# Patient Record
Sex: Female | Born: 1955 | Race: Asian | Hispanic: No | Marital: Married | Smoking: Never smoker
Health system: Southern US, Community
[De-identification: ages and names within clinical notes are randomized; demographics above are authoritative.]

## PROBLEM LIST (undated history)

## (undated) HISTORY — PX: BREAST BIOPSY: SHX20

## (undated) HISTORY — PX: ABDOMINAL HYSTERECTOMY: SHX81

---

## 2016-02-14 ENCOUNTER — Other Ambulatory Visit: Payer: Self-pay | Admitting: Family Medicine

## 2016-02-14 DIAGNOSIS — Z1231 Encounter for screening mammogram for malignant neoplasm of breast: Secondary | ICD-10-CM

## 2016-06-04 ENCOUNTER — Ambulatory Visit
Admission: RE | Admit: 2016-06-04 | Discharge: 2016-06-04 | Disposition: A | Discharge status: Home / Self Care | Payer: Federal, State, Local not specified - PPO | Source: Ambulatory Visit | Attending: Family Medicine | Admitting: Family Medicine

## 2016-06-04 ENCOUNTER — Encounter: Payer: Self-pay | Admitting: Radiology

## 2016-06-04 DIAGNOSIS — Z1231 Encounter for screening mammogram for malignant neoplasm of breast: Secondary | ICD-10-CM | POA: Diagnosis not present

## 2016-06-12 ENCOUNTER — Other Ambulatory Visit: Payer: Self-pay | Admitting: *Deleted

## 2016-06-12 ENCOUNTER — Inpatient Hospital Stay
Admission: RE | Admit: 2016-06-12 | Discharge: 2016-06-12 | Disposition: A | Discharge status: Home / Self Care | Payer: Self-pay | Source: Ambulatory Visit | Attending: *Deleted | Admitting: *Deleted

## 2016-06-12 DIAGNOSIS — Z9289 Personal history of other medical treatment: Secondary | ICD-10-CM

## 2018-03-12 ENCOUNTER — Other Ambulatory Visit: Payer: Self-pay | Admitting: Family Medicine

## 2018-03-12 DIAGNOSIS — Z1231 Encounter for screening mammogram for malignant neoplasm of breast: Secondary | ICD-10-CM

## 2018-03-24 ENCOUNTER — Ambulatory Visit: Payer: Federal, State, Local not specified - PPO

## 2018-04-07 ENCOUNTER — Ambulatory Visit
Admission: RE | Admit: 2018-04-07 | Discharge: 2018-04-07 | Disposition: A | Discharge status: Home / Self Care | Payer: Federal, State, Local not specified - PPO | Source: Ambulatory Visit | Attending: Family Medicine | Admitting: Family Medicine

## 2018-04-07 ENCOUNTER — Encounter (INDEPENDENT_AMBULATORY_CARE_PROVIDER_SITE_OTHER): Payer: Self-pay

## 2018-04-07 DIAGNOSIS — Z1231 Encounter for screening mammogram for malignant neoplasm of breast: Secondary | ICD-10-CM | POA: Diagnosis present

## 2019-03-01 ENCOUNTER — Other Ambulatory Visit: Payer: Self-pay | Admitting: Family Medicine

## 2019-03-01 DIAGNOSIS — Z1231 Encounter for screening mammogram for malignant neoplasm of breast: Secondary | ICD-10-CM

## 2019-04-12 ENCOUNTER — Inpatient Hospital Stay: Admission: RE | Admit: 2019-04-12 | Payer: Federal, State, Local not specified - PPO | Source: Ambulatory Visit

## 2019-05-13 ENCOUNTER — Encounter: Payer: Self-pay | Admitting: Emergency Medicine

## 2019-05-13 ENCOUNTER — Ambulatory Visit
Admission: EM | Admit: 2019-05-13 | Discharge: 2019-05-13 | Disposition: A | Discharge status: Home / Self Care | Payer: Federal, State, Local not specified - PPO | Attending: Family Medicine | Admitting: Family Medicine

## 2019-05-13 ENCOUNTER — Telehealth: Payer: Self-pay | Admitting: Family Medicine

## 2019-05-13 ENCOUNTER — Other Ambulatory Visit: Payer: Self-pay

## 2019-05-13 ENCOUNTER — Ambulatory Visit (INDEPENDENT_AMBULATORY_CARE_PROVIDER_SITE_OTHER): Payer: Federal, State, Local not specified - PPO

## 2019-05-13 DIAGNOSIS — S39012A Strain of muscle, fascia and tendon of lower back, initial encounter: Secondary | ICD-10-CM | POA: Diagnosis not present

## 2019-05-13 DIAGNOSIS — M545 Low back pain: Secondary | ICD-10-CM

## 2019-05-13 DIAGNOSIS — M542 Cervicalgia: Secondary | ICD-10-CM | POA: Diagnosis not present

## 2019-05-13 DIAGNOSIS — S46911A Strain of unspecified muscle, fascia and tendon at shoulder and upper arm level, right arm, initial encounter: Secondary | ICD-10-CM

## 2019-05-13 DIAGNOSIS — M25511 Pain in right shoulder: Secondary | ICD-10-CM | POA: Diagnosis not present

## 2019-05-13 DIAGNOSIS — M546 Pain in thoracic spine: Secondary | ICD-10-CM

## 2019-05-13 MED ORDER — CYCLOBENZAPRINE HCL 10 MG PO TABS: 10.0000 mg | ORAL_TABLET | Freq: Every day | ORAL | 0 refills | Status: AC

## 2019-05-13 NOTE — Discharge Instructions (Signed)
Rest, ice/heat, over the counter analgesics as needed
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
## 2019-05-13 NOTE — Telephone Encounter (Signed)
rx printed for flexeril
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
